# Patient Record
Sex: Male | Born: 1955 | Race: White | Hispanic: No | Marital: Single | Smoking: Current some day smoker
Health system: Southern US, Community
[De-identification: ages and names within clinical notes are randomized; demographics above are authoritative.]

---

## 2015-01-01 ENCOUNTER — Encounter: Admit: 2015-01-01

## 2015-01-01 ENCOUNTER — Inpatient Hospital Stay: Admit: 2015-01-01 | Discharge: 2015-01-01 | Disposition: A | Discharge status: Home / Self Care

## 2015-01-01 DIAGNOSIS — W010XXA Fall on same level from slipping, tripping and stumbling without subsequent striking against object, initial encounter: Secondary | ICD-10-CM

## 2015-01-01 MED ORDER — OXYCODONE-ACETAMINOPHEN 5-325 MG PO TABS
5-325 MG | ORAL_TABLET | Freq: Four times a day (QID) | ORAL | Status: AC | PRN
Start: 2015-01-01 — End: 2015-01-04

## 2015-01-01 MED ORDER — OXYCODONE-ACETAMINOPHEN 5-325 MG PO TABS
5-325 MG | Freq: Once | ORAL | Status: AC
Start: 2015-01-01 — End: 2015-01-01
  Administered 2015-01-01: 18:00:00 1 via ORAL

## 2015-01-01 MED FILL — OXYCODONE-ACETAMINOPHEN 5-325 MG PO TABS: 5-325 MG | ORAL | Qty: 1

## 2015-01-01 NOTE — ED Provider Notes (Signed)
[facility]        HPI:  01/01/15,   Time: 1:37 PM         Tony Barnes is a 59 y.o. male presenting to the ED for  Left shoulder pain diffuse incurred ~4 hours ago after falling up 4 stairs hitting it directly w/o hitting his head, having LOC, or incurring other injuries.   The complaint has been persistent, severe in severity, and worsened by changing position, movement of the left arm in any direction, sitting in the car. He has taken nothing for his pain. Denies HA/N/V/SOB/CP.     ROS:   Pertinent positives and negatives are stated within HPI, all other systems reviewed and are negative.  --------------------------------------------- PAST HISTORY ---------------------------------------------  Past Medical History:  has no past medical history on file.    Past Surgical History:  has past surgical history that includes shoulder surgery (Left).    Social History:  reports that he has been smoking Cigarettes.  He has been smoking about 0.00 packs per day. He does not have any smokeless tobacco history on file. He reports that he does not drink alcohol or use illicit drugs.    Family History: family history is not on file.     The patient's home medications have been reviewed.    Allergies: Review of patient's allergies indicates no known allergies.    -------------------------------------------------- RESULTS -------------------------------------------------  All laboratory and radiology results have been personally reviewed by myself   LABS:  No results found for this visit on 01/01/15.    RADIOLOGY:  Interpreted by Radiologist.  XR Shoulder Left Standard   Final Result   IMPRESSION:   No acute findings.             ------------------------- NURSING NOTES AND VITALS REVIEWED ---------------------------   The nursing notes within the ED encounter and vital signs as below have been reviewed.   BP 126/81 mmHg  Pulse 78  Temp(Src) 98.7 F (37.1 C) (Oral)  Resp 17  Ht  (1.727 m)  Wt 170 lb 3 oz (77.197  kg)  BMI 25.88 kg/m2  SpO2 96%  Oxygen Saturation Interpretation: Normal      ---------------------------------------------------PHYSICAL EXAM--------------------------------------      Constitutional/General: Alert and oriented x4, well appearing, non toxic in NAD  Head: NC/AT  Eyes: PERRL, EOMI    Neck: Supple, full ROM w/o pain, no cervical bony tenderness  Pulmonary: Lungs clear to auscultation bilaterally, no wheezes, rales, or rhonchi. Not in respiratory distress  Cardiovascular:  Regular rate and rhythm, no murmurs, gallops, or rubs. 2+ distal pulses    Extremities: Moves all extremities x 3, except left shoulder with limited ROM due to pain. No step off of the left clavicle, shoulder dislocation ant/post appreciated. Warm and well perfused. Left hand, wrist, elbow: pulses +2, reflexes +2, temperature, strength, color, cap refill <2 secs, normal and all equal to the right.   Right hand dominant. Old healed scar over the left clavicular area.     Skin: warm and dry without rash  Neurologic: GCS 15, Face is symmetric (VII), EOMs are intact (III, IV, VI), pupils are equal and reactive (II, III), tongue is midline (XII). The patient is walking in a smooth balanced and coordinated fashion w/o bumping or walking into anything (vision), talking w/ appropriate content and fluency, is fully attentive, and provided a spontaneous coherent history.     Psych: Normal Affect  Post sling exam left hand, wrist elbow  pulses +2, reflexes +  2, temperature,  color, cap refill <2 secs, normal and all equal to the right.       ------------------------------ ED COURSE/MEDICAL DECISION MAKING----------------------  Medications   oxyCODONE-acetaminophen (PERCOCET) 5-325 MG per tablet 1 tablet (1 tablet Oral Given 01/01/15 1351)         Medical Decision Making:    Patient fall w/ hx of left shoulder surgery- xray w/ no acute findings, sling and pain management, ortho referral for further E & M     Counseling:   The emergency provider  has spoken with the patient and discussed today's results, in addition to providing specific details for the plan of care and counseling regarding the diagnosis and prognosis.  Questions are answered at this time and they are agreeable with the plan.      --------------------------------- IMPRESSION AND DISPOSITION ---------------------------------    IMPRESSION  Fall  Left shoulder strain    DISPOSITION  Disposition: Discharge to home  Patient condition is stable  Rest, Ice, Sling to remain until seen by ortho  You have been prescribed a narcotic medication. You may not work, drive or operate machinery of any kind whilst taking a narcotic medication or you may incur serious harm, including death. Narcotic medication may cause constipation. Therefore you should take a stool softener like Colace 200 mg every 12 hours, eat a high fiber diet, drink a full 8 oz glass of water at least 6-8 times daily. Do not share or illegally sell or redistribute the prescribed narcotic medication.  Sling use and care.  F/U with orthopedics in 1 day for appointment                               Baruch GoutyKimberly Amaani Guilbault, CNP  01/01/15 1459

## 2015-01-01 NOTE — ED Notes (Signed)
Pt states he did receive relief of the pain with medication.    Christene SlatesMichael A Eleisha Branscomb, RN  01/01/15 [phone]

## 2015-01-01 NOTE — ED Notes (Signed)
A large sling was applied to the pt's left arm. Pt understand it's application and use. He states he has worn them before.    Christene SlatesMichael A Carzell Saldivar, RN  01/01/15 1435

## 2015-02-04 ENCOUNTER — Emergency Department
Admission: EM | Admit: 2015-02-04 | Discharge: 2015-02-04 | Disposition: A | Discharge status: Home / Self Care | Payer: Self-pay | Attending: Emergency Medicine | Admitting: Emergency Medicine

## 2015-02-04 ENCOUNTER — Emergency Department: Payer: Self-pay

## 2015-02-04 DIAGNOSIS — S46912A Strain of unspecified muscle, fascia and tendon at shoulder and upper arm level, left arm, initial encounter: Secondary | ICD-10-CM | POA: Insufficient documentation

## 2015-02-04 DIAGNOSIS — W108XXA Fall (on) (from) other stairs and steps, initial encounter: Secondary | ICD-10-CM | POA: Insufficient documentation

## 2015-02-04 DIAGNOSIS — Z8781 Personal history of (healed) traumatic fracture: Secondary | ICD-10-CM | POA: Insufficient documentation

## 2015-02-04 MED ORDER — HYDROCODONE-ACETAMINOPHEN 5-325 MG PO TABS
1.0000 | ORAL_TABLET | Freq: Four times a day (QID) | ORAL | Status: DC | PRN
Start: 2015-02-04 — End: 2018-01-03

## 2015-02-04 MED ORDER — NAPROXEN 500 MG PO TABS
500.0000 mg | ORAL_TABLET | Freq: Two times a day (BID) | ORAL | Status: AC | PRN
Start: 2015-02-04 — End: ?

## 2015-02-04 MED ORDER — CYCLOBENZAPRINE HCL 10 MG PO TABS
10.0000 mg | ORAL_TABLET | Freq: Three times a day (TID) | ORAL | Status: AC | PRN
Start: 2015-02-04 — End: ?

## 2015-02-04 NOTE — ED Notes (Addendum)
Pt had left clavicle sx 1 year ago in [place] at [facility] with steel plate placed, pt is from out of town and here for a funeral. Pt fell approximately 6 feet while carrying luggage and fell down 6 stairs and could not break his fall with his hands as he was carrying luggage. Pt did not hit head/no LOC/headache. Pt landed on left shoulder. Denies use of anticoagulants. Movement worsens pain.

## 2015-02-04 NOTE — ED Provider Notes (Signed)
[facility] EMERGENCY DEPARTMENT History and Physical Exam      Patient Name: Reginald Schwartz, Reginald Schwartz  Encounter Date:  02/04/2015  Attending Physician: Hall Busing Joao Mccurdy, MD  PCP: Christa See, MD  Patient DOB:  10/02/1955  MRN:  [id number]  Room:  E50/E50-A      History of Presenting Illness     Chief complaint: Shoulder Injury    HPI/ROS is limited by: none  HPI/ROS given by: patient    Location: left shoulder and clavicle  Duration: just happened this am  Severity: moderate    Constant Mandeville is a 59 y.o. male who presents with moderate pain to the left shoulder after falling from a few stairs this morning.  He is here from out of town for a funeral and drove 5 hours today.  The shoulder pain has gotten worse and the arm feels stiff, no weakness or numbness, no cuts or bruises, he has surgery for a clavicle fracture in the past that is healed.  He is worried that screws are loose or he refractured.      Review of Systems     Review of Systems   Constitutional: Negative.  Negative for activity change.   Respiratory: Negative.    Cardiovascular: Negative.    Skin: Negative.    Neurological: Negative for weakness and numbness.   Psychiatric/Behavioral: Negative.          Allergies     Pt has No Known Allergies.    Medications     Current Outpatient Rx   Name  Route  Sig  Dispense  Refill   . cyclobenzaprine (FLEXERIL) 10 MG tablet    Oral    Take 1 tablet (10 mg total) by mouth every 8 (eight) hours as needed.    30 tablet    0     . HYDROcodone-acetaminophen (NORCO) 5-325 MG per tablet    Oral    Take 1-2 tablets by mouth every 6 (six) hours as needed.    20 tablet    0     . naproxen (NAPROSYN) 500 MG tablet    Oral    Take 1 tablet (500 mg total) by mouth 2 (two) times daily as needed.    30 tablet    0          Past Medical History     Pt has no past medical history on file.    Past Surgical History     Pt has past surgical history that includes Shoulder surgery.    Family History     The family history is not on  file.    Social History     Pt reports that he has been smoking Cigarettes.  He has smoked for the past 15 years. He does not have any smokeless tobacco history on file. He reports that he drinks alcohol. He reports that he does not use illicit drugs.    Physical Exam     Blood pressure 123/96, pulse 104, temperature 97.7 F (36.5 C), resp. rate 18, height 1.727 m, weight 76.1 kg, SpO2 99 %.    Physical Exam   Constitutional: He is oriented to person, place, and time. He appears well-developed and well-nourished.   HENT:   Head: Normocephalic.   Neck: Normal range of motion. Neck supple.   Cardiovascular: Normal rate.    Pulmonary/Chest: Effort normal.   Musculoskeletal:   Left shoulder pain, holding and resisting exam but rotator cuff seems ok, normal SM function, decent  ROM, most of pain up by traps and clavicle scar.   Neurological: He is alert and oriented to person, place, and time.   Skin: Skin is warm and dry.   Psychiatric: He has a normal mood and affect.   Nursing note and vitals reviewed.       Orders Placed     Orders Placed This Encounter   Procedures   . XR Shoulder Left 2+ Views       Diagnostic Results       The results of the diagnostic studies below have been reviewed by myself:    Labs  Results     ** No results found for the last 24 hours. **          Radiologic Studies  Radiology Results (24 Hour)     Procedure Component Value Units Date/Time    XR Shoulder Left 2+ Views [[id number]] Collected:  02/04/15 1155    Order Status:  Completed Updated:  02/04/15 1157    Narrative:      Clinical History:  Left shoulder pain after falling. Prior surgery left clavicle.    Examination:  AP internal rotation, AP external rotation and scapular Y views of the left shoulder.    Comparison:  None available.    Findings:  There is a fixation plate and multiple screws along the mid to distal clavicle. Bones and hardware in good position.  Healed fracture deformity noted. No other acute fracture or malalignment.  Soft tissues unremarkable.      Impression:      Old surgical fixation of left clavicle fracture. No acute abnormality.    ReadingStation:[id number]            MDM / Critical Care     Blood pressure 123/96, pulse 104, temperature 97.7 F (36.5 C), resp. rate 18, height 1.727 m, weight 76.1 kg, SpO2 99 %.    This patient presents to the Emergency Department following a fall or traumatic event and has sustained an injury to an extremity.   Evaluation and treatment for this patient was performed and revealed that there was no serious pathology identified in the ED, and no threat of loss of limb.  Sequelae of their injury were considered in the differential diagnosis including sprain, strain, fracture, contusion, abrasion and ligamentous injury. Any serious sequelae that would require admission and immediate surgical repair were thought unlikely, and the patient is stable for discharge home.  The diagnostic impression and plan and appropriate follow-up were discussed and agreed upon with the patient and/or family.  If performed the results of lab/radiology tests were reviewed and discussed, wrap was applied, and crutch training performed.  All questions were answered and concerns addressed.  Fall/trauma/sports precautions have been given and the patient was warned to return immediately for worsening symptoms or any acute concerns and to follow up with an orthopaedic specialist within an appropriate time as discussed.          Procedures     none    EKG     none    Diagnosis / Disposition     Clinical Impression  1. Fall (on) (from) other stairs and steps, initial encounter    2. History of fracture of clavicle    3. Shoulder strain, left, initial encounter        Disposition  ED Disposition     Discharge Reginald Schwartz discharge to home/self care.    Condition at disposition: Stable  Prescriptions  New Prescriptions    CYCLOBENZAPRINE (FLEXERIL) 10 MG TABLET    Take 1 tablet (10 mg total) by mouth every 8  (eight) hours as needed.    HYDROCODONE-ACETAMINOPHEN (NORCO) 5-325 MG PER TABLET    Take 1-2 tablets by mouth every 6 (six) hours as needed.    NAPROXEN (NAPROSYN) 500 MG TABLET    Take 1 tablet (500 mg total) by mouth 2 (two) times daily as needed.                   Ismeal Heider, Hall Busing, MD  02/04/15 1255

## 2015-02-04 NOTE — Discharge Instructions (Signed)
Fall, Uncertain Cause  You have had a fall today. but the cause of your fall is not certain. Falls can occur due to slipping, tripping or losing your balance. A fall can also occur from a fainting spell or seizure.  Because the cause of your fall today is not certain, it is possible that a fainting spell or seizure was the cause. This means that it could happen again, without warning. If you fall again, without a cause, then you should return to this facility promptly to have further tests. Otherwise, follow up with your doctor as explained below.  Home Care:  1) Rest today and resume your normal activities as soon as you are feeling back to normal. It is best to remain with someone who can check on you for the next 24 hours to watch for another episode of falling.  2) If you were injured during the fall, follow the advice from your doctor regarding care of your injury.  3) If you become light-headed or dizzy, lie down immediately or sit and lean forward with your head down.  4) As a precaution, do not drive a car or operate dangerous equipment, do not take a bath alone (use a shower instead) and do not swim alone until you see your doctor. A condition causing fainting or seizures must be ruled out before resuming these activities.  5)You may use acetaminophen (Tylenol) or ibuprofen (Motrin, Advil) to control pain, unless another pain medicine was prescribed. [ NOTE : If you have chronic liver or kidney disease or ever had a stomach ulcer or GI bleeding, talk with your doctor before using these medicines.]  6) Keep your appointments for any further testing that may have been scheduled for you.  Follow Up:  Unless, given other advice, call your doctor on the next office day to advise of your fall and to schedule an appointment.  Get Prompt Medical Attention  if any of the following occur:  -- Another unexplained fall  -- Dizziness, fainting or seizure  -- Severe headache  -- Chest pain or shortness of breath  --  Palpitations (very rapid or very slow or irregular heart beat)  -- Blood in vomit, stools (black or red color)  -- Weakness of an arm or leg or one side of the face  -- Difficulty with speech or vision   2000-2015 [employer] [address], [place], [place] [address]. All rights reserved. This information is not intended as a substitute for professional medical care. Always follow your healthcare professional's instructions.          Muscle Strain,Extremity  A MUSCLE STRAIN is a stretching and tearing of muscle fibers. This causes pain, especially with motion of that muscle. There may also be some swelling and bruising.  Home Care:  1) Keep the injured area raised to reduce pain and swelling. This is especially important during the first 48 hours.  2) Make an ice pack (ice cubes in a plastic bag, wrapped in a towel) and apply for 20 minutes every 1-2 hours the first day. You should continue with ice packs 3-4 times a day for the second and third days. Unless otherwise instructed, on the fourth day you may begin hot soaks or hot packs (small towel soaked in hot water) 3-4 times a day while you gently exercise the involved area.  3) You may use acetaminophen (Tylenol) or ibuprofen (Motrin, Advil) to control pain, unless another medicine was prescribed. [ NOTE : If you  have chronic liver or kidney disease or ever had a stomach ulcer or GI bleeding, talk with your doctor before using these medicines.]  4) For LEG STRAINS: If CRUTCHES have been recommended, do not bear full weight on the injured leg until you can do so without pain. You may return to sports when you are able to hop and run on the injured leg without pain.  Follow Up  with your doctor or this facility if you are not improving within the next five days.  Get Prompt Medical Attention  if any of the following occur:  -- Fingers or toes become swollen, cold, blue, numb or tingly  -- Pain or swelling increases   2000-2015 [employer] [address] [place], [place] [address]. All rights reserved. This information is not intended as a substitute for professional medical care. Always follow your healthcare professional's instructions.          Self-Care for Strains and Sprains      Most minor strains and sprains can be treated with self-care. Recovering from a strain or sprain may take 6 to 8 weeks. Your self-care goal is to reduce pain and immobilize the injury to speed healing.     Support the Injured Area  Wrapping the injured area provides support for short, necessary activities. Be careful not to wrap the area too tightly. This could cut off the blood supply.   Support a wrist, elbow, or shoulder with a sling.   Wrap an ankle or knee with an elastic bandage.   Tape a finger or toe to the one next to it.  Use Cold and Heat  Cold reduces swelling. Both cold and heat reduce pain. Heat should not be used in the initial treatment of the injury. When using cold or heat, always place a towel between the pack and your skin.   Apply ice or a cold pack 10 to 15 minutes every hour you're awake for the first 2 days.   After the swelling goes down, use cold or heat to control pain. Don't use heat late in the day, since it can cause swelling when you're not active.  Rest and Elevate  Rest and elevation help your injury heal faster.   Raise the injured area above your heart level.   Keep the injured area from moving.   Limit the use of the joint or limb.  Use Medications   Aspirin reduces pain and swelling. (Note: Don't give aspirin to a child 63 or younger unless prescribed by the doctor.)   Aspirin substitutes, such as ibuprofen, can reduce pain. Some substitutes reduce swelling, too. Ask your pharmacist which substitutes you can use.   [address] [employer] [address] [place], [place] [address]. All rights reserved. This information is not intended as a substitute for professional medical care. Always follow your  healthcare professional's instructions.          Treating Strains and Sprains     Strains and sprains happen when muscles or other soft tissues near your bones stretch or tear. These injuries can cause bruising, swelling, and pain. To ease your discomfort and speed the healing of your strain or sprain, follow the tips below. Remember, a strain or sprain can take 6 to 8 weeks to heal.    Ice First, Heat Later   Use ice for the first 24 to 48 hours after injury. Ice helps prevent swelling and reduce pain. Ice the injury for no  more than 20 minutes at a time and allow at least 20 minutes between icing sessions.   Apply heat after the first72 hours, once the swelling has gone down. Heat relaxes muscles and increases blood flow. Soak the injured area in warm water or use a heating pad set on low for no more than 15 minutes at a time.  Wrap and Elevate   Wrap an injured limb firmly with an elastic bandage. This provides support and helps prevent swelling. Don't wear an elastic bandage overnight. Watch for tingling, numbness, or increased pain, and remove the bandage immediately if any of these occurs.   Elevate the injured area to help reduce swelling and throbbing. It's best to raise an injured limb above the level of your heart.    Medications   Over-the-counter medications such as aspirin (but do NOT give aspirin to children or teens), acetaminophen, or ibuprofen can help reduce pain. Some also help reduce swelling.   Take medications only as directed.   Rest the area even if medications are controlling the pain.  Rest   Rest the injured area by not using it for 24 hours.   When you're ready, return slowly to your normal activities. Rest the injured area often.   Don't use or walk on an injured limb if it hurts.   [address] [employer] [address] [place], [place] [address]. All rights reserved. This information is not intended as a substitute for professional medical care. Always follow  your healthcare professional's instructions.

## 2016-05-14 ENCOUNTER — Emergency Department (HOSPITAL_COMMUNITY): Payer: Self-pay

## 2016-05-14 ENCOUNTER — Emergency Department (HOSPITAL_COMMUNITY)
Admission: EM | Admit: 2016-05-14 | Discharge: 2016-05-14 | Disposition: A | Discharge status: Home / Self Care | Payer: Self-pay | Attending: Emergency Medicine | Admitting: Emergency Medicine

## 2016-05-14 ENCOUNTER — Encounter (HOSPITAL_COMMUNITY): Payer: Self-pay | Admitting: Emergency Medicine

## 2016-05-14 DIAGNOSIS — F17200 Nicotine dependence, unspecified, uncomplicated: Secondary | ICD-10-CM | POA: Insufficient documentation

## 2016-05-14 DIAGNOSIS — M25512 Pain in left shoulder: Secondary | ICD-10-CM | POA: Insufficient documentation

## 2016-05-14 DIAGNOSIS — Y999 Unspecified external cause status: Secondary | ICD-10-CM | POA: Insufficient documentation

## 2016-05-14 DIAGNOSIS — Y9389 Activity, other specified: Secondary | ICD-10-CM | POA: Insufficient documentation

## 2016-05-14 DIAGNOSIS — Y929 Unspecified place or not applicable: Secondary | ICD-10-CM | POA: Insufficient documentation

## 2016-05-14 DIAGNOSIS — W11XXXA Fall on and from ladder, initial encounter: Secondary | ICD-10-CM | POA: Insufficient documentation

## 2016-05-14 MED ORDER — TRAMADOL HCL 50 MG PO TABS: 50.0000 mg | ORAL_TABLET | Freq: Four times a day (QID) | ORAL | 0 refills | Status: AC | PRN

## 2016-05-14 MED ORDER — HYDROCODONE-ACETAMINOPHEN 5-325 MG PO TABS
1.0000 | ORAL_TABLET | Freq: Once | ORAL | Status: AC
  Administered 2016-05-14: 1 via ORAL
  Filled 2016-05-14: qty 1

## 2016-05-14 NOTE — Discharge Instructions (Signed)
Please ice shoulder area for 20 minutes at a time three times a day. Also take Ibuprofen 400mg  three times a day for the next 5 days to help with pain and inflammation.

## 2016-05-14 NOTE — ED Triage Notes (Signed)
Pt c/o stabbing left shoulder pain onset yesterday morning at 0800 after falling about 5 feet down from a ladder, landing on left shoulder. Point tenderness to left clavicle, acromion. Left clavicle deformed from previous fracture. No head injury or LOC, no anticoagulants. Motor function, sensation intact. 2+ radial pulse.
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
## 2016-05-14 NOTE — ED Notes (Signed)
Pt given discharge instructions and expressed understanding.  Signature pad unable to capture signature.  Pt ambulated out of department to meet ride to go home.

## 2016-12-04 ENCOUNTER — Encounter: Admit: 2016-12-04

## 2016-12-04 ENCOUNTER — Inpatient Hospital Stay: Admit: 2016-12-04 | Discharge: 2016-12-04 | Attending: Emergency Medicine

## 2016-12-04 ENCOUNTER — Encounter

## 2016-12-04 DIAGNOSIS — S40012A Contusion of left shoulder, initial encounter: Secondary | ICD-10-CM

## 2016-12-04 MED ORDER — IBUPROFEN 800 MG PO TABS
800 MG | ORAL_TABLET | Freq: Three times a day (TID) | ORAL | 0 refills | Status: AC | PRN
Start: 2016-12-04 — End: ?

## 2016-12-04 MED ORDER — OXYCODONE-ACETAMINOPHEN 5-325 MG PO TABS
5-325 MG | Freq: Once | ORAL | Status: AC
Start: 2016-12-04 — End: 2016-12-04
  Administered 2016-12-04: 19:00:00 1 via ORAL

## 2016-12-04 MED ORDER — HYDROCODONE-ACETAMINOPHEN 5-325 MG PO TABS
5-325 MG | ORAL_TABLET | Freq: Four times a day (QID) | ORAL | 0 refills | Status: AC | PRN
Start: 2016-12-04 — End: 2016-12-08

## 2016-12-04 MED FILL — OXYCODONE-ACETAMINOPHEN 5-325 MG PO TABS: 5-325 MG | ORAL | Qty: 1

## 2016-12-04 NOTE — ED Provider Notes (Addendum)
As physician-in-triage, I performed a medical screening history and physical exam on this patient.    HISTORY OF PRESENT ILLNESS  Tony Barnes is a 61 y.o. male L shoulder pain, L arm pain after falling 6 feet off a ladder loading some boxes. Occurred at 730am. Hx of plate and screws. Denies neck pain, chest pain, abdominal pain, anticoagulation.      PHYSICAL EXAM  There were no vitals taken for this visit.    On exam, the patient appears well-hydrated, well-nourished, and in no acute distress. Mucous membranes are moist. Speech is clear. Breathing is unlabored.  Skin is dry. Mental status is normal. The patient has normal gait, moves all extremities, and is without facial droop.     Comment: Please note this report has been produced using speech recognition software and may contain errors related to that system including errors in grammar, punctuation, and spelling, as well as words and phrases that may be inappropriate. If there are any questions or concerns please feel free to contact the dictating provider for clarification.        Tony CritchleyMeenal Imonie Tuch, MD  12/04/16 1340       Tony CritchleyMeenal Jayci Ellefson, MD  12/04/16 1346

## 2016-12-04 NOTE — ED Notes (Signed)
Patient medicated. Sling ordered.      Park Breedyan Amandeep Hogston, RN  12/04/16 [phone]

## 2016-12-04 NOTE — ED Notes (Signed)
Sling applied. Good PMS in L arm.      Park Breedyan Sia Gabrielsen, RN  12/04/16 [phone]

## 2016-12-04 NOTE — ED Notes (Signed)
Discharge education reviewed. Questions answered. Medications clarified. Belongings gathered. Discharge instructions signed. All belongings accounted for. Patient discharged to home via POV.      Park Breedyan Brighten Buzzelli, RN  12/04/16 [phone]
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
## 2016-12-04 NOTE — ED Notes (Signed)
Patient updated on POC. Patient's pain is decreasing.      Park Breed, RN  12/04/16 1536

## 2016-12-04 NOTE — ED Notes (Signed)
Patient placed back in room and to be medicated.      Park Breedyan Camren Henthorn, RN  12/04/16 [phone]

## 2017-04-25 IMAGING — CR DG CLAVICLE*L*
2 series · 2 of 2 positions shown · non-contrast
Comparison: None.

CLINICAL DATA: Left shoulder pain status post fall.

EXAM:
LEFT CLAVICLE - 2+ VIEWS

[w clavicle ap left]
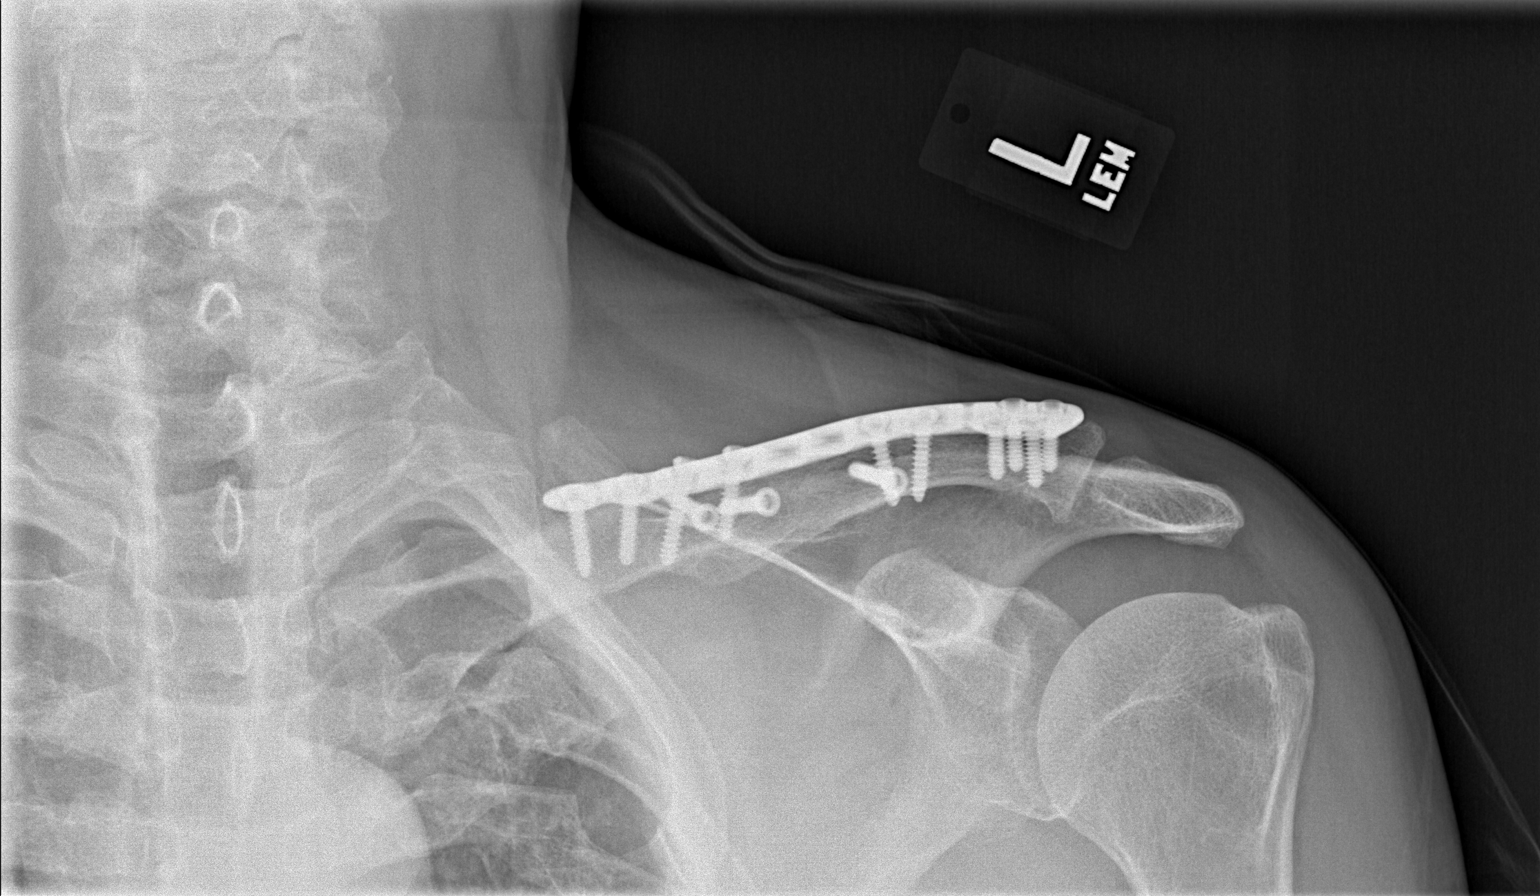

[w clavicle tangential left]
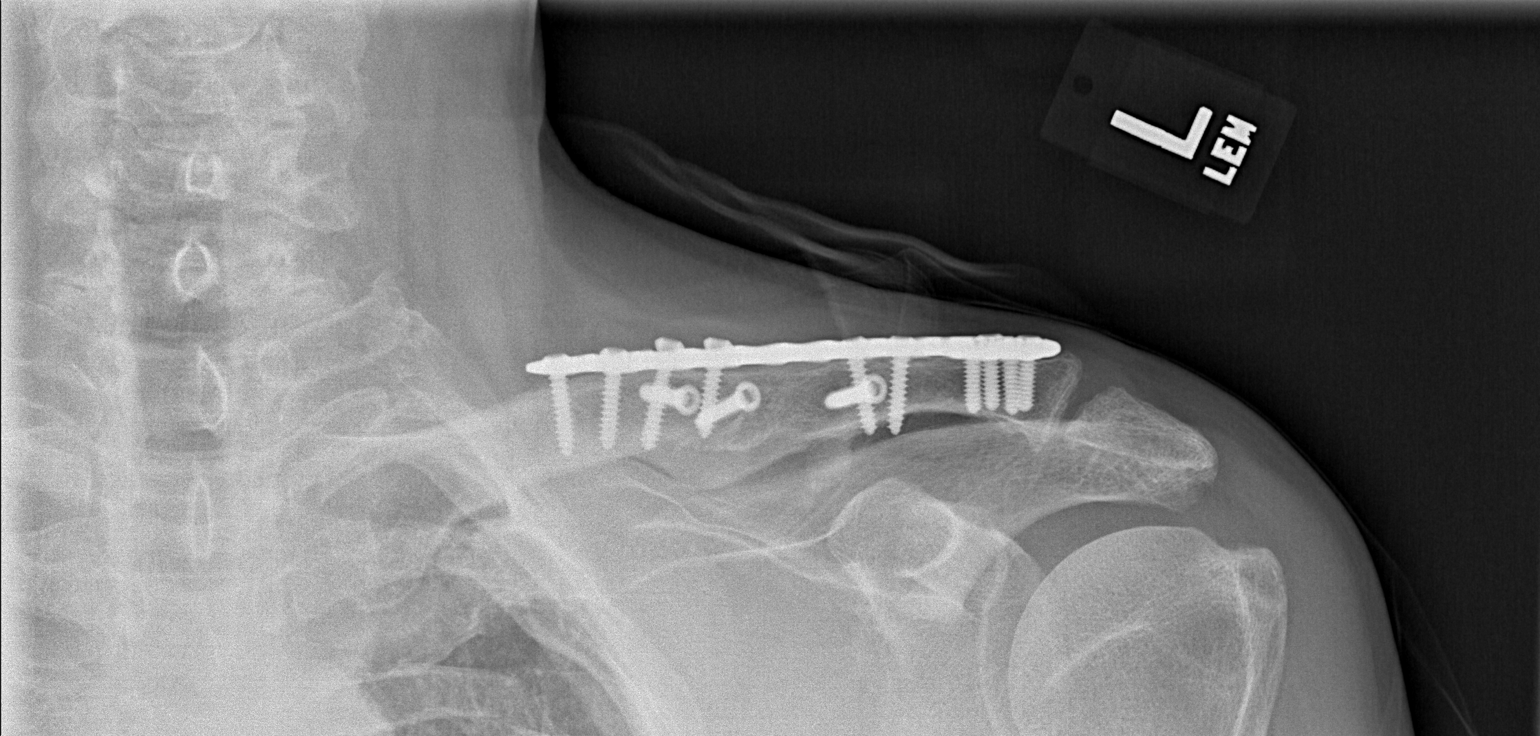

[2 of 2 positions shown; findings below may reference images not displayed]

FINDINGS: There is no evidence of fracture or other focal bone lesions. There
is a prior plate and screw fixation of the mid and distal clavicle
with cortical irregularity consistent with healed fracture. No
evidence of hardware loosening or fracture. Soft tissues are
unremarkable.
IMPRESSION: No acute fracture or dislocation identified about the left clavicle.

Status post plate and screw fixation with cortical irregularity from
healed left clavicular fracture.

## 2017-09-18 DEATH — deceased
